# Patient Record
Sex: Male | Born: 1965
Health system: Southern US, Community
[De-identification: ages and names within clinical notes are randomized; demographics above are authoritative.]

## PROBLEM LIST (undated history)

## (undated) DIAGNOSIS — M109 Gout, unspecified: Secondary | ICD-10-CM

---

## 2016-09-21 ENCOUNTER — Emergency Department (HOSPITAL_BASED_OUTPATIENT_CLINIC_OR_DEPARTMENT_OTHER): Payer: BLUE CROSS/BLUE SHIELD

## 2016-09-21 ENCOUNTER — Encounter (HOSPITAL_BASED_OUTPATIENT_CLINIC_OR_DEPARTMENT_OTHER): Payer: Self-pay | Admitting: *Deleted

## 2016-09-21 ENCOUNTER — Emergency Department (HOSPITAL_BASED_OUTPATIENT_CLINIC_OR_DEPARTMENT_OTHER)
Admission: EM | Admit: 2016-09-21 | Discharge: 2016-09-21 | Disposition: A | Discharge status: Home / Self Care | Payer: BLUE CROSS/BLUE SHIELD | Attending: Emergency Medicine | Admitting: Emergency Medicine

## 2016-09-21 DIAGNOSIS — M109 Gout, unspecified: Secondary | ICD-10-CM

## 2016-09-21 DIAGNOSIS — M10062 Idiopathic gout, left knee: Secondary | ICD-10-CM | POA: Diagnosis not present

## 2016-09-21 DIAGNOSIS — M7989 Other specified soft tissue disorders: Secondary | ICD-10-CM

## 2016-09-21 DIAGNOSIS — M25562 Pain in left knee: Secondary | ICD-10-CM | POA: Diagnosis present

## 2016-09-21 HISTORY — DX: Gout, unspecified: M10.9

## 2016-09-21 LAB — BASIC METABOLIC PANEL
Anion gap: 10 (ref 5–15)
BUN: 14 mg/dL (ref 6–20)
CHLORIDE: 101 mmol/L (ref 101–111)
CO2: 27 mmol/L (ref 22–32)
Calcium: 9 mg/dL (ref 8.9–10.3)
Creatinine, Ser: 1.13 mg/dL (ref 0.61–1.24)
GFR calc non Af Amer: >60 mL/min (ref >60)
Glucose, Bld: 97 mg/dL (ref 65–99)
Potassium: 3.9 mmol/L (ref 3.5–5.1)
Sodium: 138 mmol/L (ref 135–145)

## 2016-09-21 LAB — CBC WITH DIFFERENTIAL/PLATELET
Basophils Absolute: 0.1 10*3/uL (ref 0.0–0.1)
Basophils Relative: 1 %
EOS PCT: 1 %
Eosinophils Absolute: 0.1 10*3/uL (ref 0.0–0.7)
HEMATOCRIT: 38.5 % — AB (ref 39.0–52.0)
Hemoglobin: 13.3 g/dL (ref 13.0–17.0)
LYMPHS ABS: 1.7 10*3/uL (ref 0.7–4.0)
LYMPHS PCT: 17 %
MCH: 29.4 pg (ref 26.0–34.0)
MCHC: 34.5 g/dL (ref 30.0–36.0)
MCV: 85.2 fL (ref 78.0–100.0)
MONO ABS: 0.9 10*3/uL (ref 0.1–1.0)
Monocytes Relative: 9 %
NEUTROS ABS: 7 10*3/uL (ref 1.7–7.7)
Neutrophils Relative %: 72 %
Platelets: 365 10*3/uL (ref 150–400)
RBC: 4.52 MIL/uL (ref 4.22–5.81)
RDW: 12.3 % (ref 11.5–15.5)
WBC: 9.8 10*3/uL (ref 4.0–10.5)

## 2016-09-21 LAB — URIC ACID: URIC ACID, SERUM: 8.2 mg/dL — AB (ref 4.4–7.6)

## 2016-09-21 MED ORDER — PREDNISONE 20 MG PO TABS: ORAL_TABLET | ORAL | 0 refills | Status: AC

## 2016-09-21 MED ORDER — OXYCODONE-ACETAMINOPHEN 5-325 MG PO TABS: 1.0000 | ORAL_TABLET | ORAL | 0 refills | Status: AC | PRN

## 2016-09-21 MED ORDER — OXYCODONE-ACETAMINOPHEN 5-325 MG PO TABS
1.0000 | ORAL_TABLET | Freq: Once | ORAL | Status: AC
  Administered 2016-09-21: 1 via ORAL
  Filled 2016-09-21: qty 1

## 2016-09-21 MED FILL — predniSONE 20 MG TABS: 20 | 9 days supply | Qty: 12 | Fill #0

## 2016-09-21 MED FILL — OXYCODONE/APAP 5/325 MG TAB: 5-325 | 4 days supply | Qty: 20 | Fill #0

## 2016-09-21 NOTE — ED Provider Notes (Signed)
[facility] Provider Note   CSN: [id number] Arrival date & time: 09/21/16  4098     History   Chief Complaint Chief Complaint  Patient presents with  . Knee Pain    HPI Bradley Estes is a 51 y.o. male.  The history is provided by the patient and medical records.  Knee Pain     51 y.o. M with hx of gout here left knee and leg pain.  Patient states he started having symptoms about a week ago.  States initially pain was localized to the left knee, improved somewhat with anti-inflammatories. He reports now pain seems to be radiating out and he is having some swelling of the left upper and lower leg which is atypical for him during gout flare. State he does have a lot of pressure in the knee when walking.  He denies any chest pain or shortness of breath. Has no known history of heart failure. He denies any recent injury to his left knee. No recent surgeries. No recent long distance travel, surgery, prolonged immobilization. He has no history of DVT or PE. No familial clotting history.  Past Medical History:  Diagnosis Date  . Gout     There are no active problems to display for this patient.   History reviewed. No pertinent surgical history.     Home Medications    Prior to Admission medications   Not on File    Family History History reviewed. No pertinent family history.  Social History Social History  Substance Use Topics  . Smoking status: Never Smoker  . Smokeless tobacco: Never Used  . Alcohol use Not on file     Allergies   Patient has no allergy information on record.   Review of Systems Review of Systems  Musculoskeletal: Positive for arthralgias.  All other systems reviewed and are negative.    Physical Exam Updated Vital Signs BP (!) 165/95 (BP Location: Right Arm)   Pulse 97   Temp 97.6 F (36.4 C) (Oral)   Resp 14   Ht 6' (1.829 m)   Wt 106.6 kg   SpO2 97%   BMI 31.87 kg/m   Physical Exam  Constitutional: He is oriented  to person, place, and time. He appears well-developed and well-nourished.  HENT:  Head: Normocephalic and atraumatic.  Mouth/Throat: Oropharynx is clear and moist.  Eyes: Conjunctivae and EOM are normal. Pupils are equal, round, and reactive to light.  Neck: Normal range of motion.  Cardiovascular: Normal rate, regular rhythm and normal heart sounds.   Pulmonary/Chest: Effort normal and breath sounds normal.  Abdominal: Soft. Bowel sounds are normal.  Musculoskeletal: Normal range of motion.  Left knee does have some mild swelling along the medial and lateral joint lines, there is no significant effusion, limited flexion and extension due to pain/swelling, there is 1+ pitting edema of the shin and swelling of the left thigh; no overlying erythema, lymphangitis, warmth to touch, or evidence of cellulitis, DP pulses intact bilaterally, normal gait  Neurological: He is alert and oriented to person, place, and time.  Skin: Skin is warm and dry.  Psychiatric: He has a normal mood and affect.  Nursing note and vitals reviewed.    ED Treatments / Results  Labs (all labs ordered are listed, but only abnormal results are displayed) Labs Reviewed  CBC WITH DIFFERENTIAL/PLATELET - Abnormal; Notable for the following:       Result Value   HCT 38.5 (*)    All other components within  normal limits  URIC ACID - Abnormal; Notable for the following:    Uric Acid, Serum 8.2 (*)    All other components within normal limits  BASIC METABOLIC PANEL    EKG  EKG Interpretation None       Radiology US Venous Img Lower Unilateral Left  Result Date: 09/21/2016 CLINICAL DATA:  51 year old male with left knee pain and swelling for 2-3 weeks. Personal history of gout. EXAM: LEFT LOWER EXTREMITY VENOUS DOPPLER ULTRASOUND TECHNIQUE: Gray-scale sonography with graded compression, as well as color Doppler and duplex ultrasound were performed to evaluate the lower extremity deep venous systems from the level  of the common femoral vein and including the common femoral, femoral, profunda femoral, popliteal and calf veins including the posterior tibial, peroneal and gastrocnemius veins when visible. The superficial great saphenous vein was also interrogated. Spectral Doppler was utilized to evaluate flow at rest and with distal augmentation maneuvers in the common femoral, femoral and popliteal veins. COMPARISON:  None. FINDINGS: Contralateral Common Femoral Vein: Respiratory phasicity is normal and symmetric with the symptomatic side. No evidence of thrombus. Normal compressibility. Common Femoral Vein: No evidence of thrombus. Normal compressibility, respiratory phasicity and response to augmentation. Saphenofemoral Junction: No evidence of thrombus. Normal compressibility and flow on color Doppler imaging. Profunda Femoral Vein: No evidence of thrombus. Normal compressibility and flow on color Doppler imaging. Femoral Vein: No evidence of thrombus. Normal compressibility, respiratory phasicity and response to augmentation. Popliteal Vein: No evidence of thrombus. Normal compressibility, respiratory phasicity and response to augmentation. Calf Veins: No evidence of thrombus. Normal compressibility and flow on color Doppler imaging. Superficial Great Saphenous Vein: No evidence of thrombus. Normal compressibility and flow on color Doppler imaging. Venous Reflux:  None. Other Findings:  None. IMPRESSION: No evidence of left lower extremity deep venous thrombosis. Electronically Signed   By: Odessa Fleming M.D.   On: 09/21/2016 10:53   Dg Knee Complete 4 Views Left  Result Date: 09/21/2016 CLINICAL DATA:  Unable to straighten the left knee.  Left knee pain. EXAM: LEFT KNEE - COMPLETE 4+ VIEW COMPARISON:  None. FINDINGS: No acute fracture or dislocation. Small left knee joint effusion. Mild lateral femorotibial compartment joint space narrowing. Soft tissues are normal. IMPRESSION: No acute osseous injury of the left knee.  Electronically Signed   By: Elige Ko   On: 09/21/2016 10:58    Procedures Procedures (including critical care time)  Medications Ordered in ED Medications - No data to display   Initial Impression / Assessment and Plan / ED Course  I have reviewed the triage vital signs and the nursing notes.  Pertinent labs & imaging results that were available during my care of the patient were reviewed by me and considered in my medical decision making (see chart for details).  51 year old male with left knee pain and left leg swelling. He is afebrile and nontoxic. There is no significant erythema, warmth to touch, lymphangitis, or other abnormalities noted of the left leg or knee. He does have some 1+ pitting edema of the lower leg but no palpable cords.  DP pulse intact, normal sensation.  VSS.  Clinical suspicion for septic joint is low.  Likely acute gout flare vs DVT.  Labs and imaging studies ordered.  Labs as above-- normal WBC count, uric acid elevated.  X-ray and venous duplex negative for acute findings.  No chest pain or SOB.  Does not appear clinically fluid overloaded.  Suspect the swelling is sequela of his gout flare, possibly  due to the fact it has been somewhat prolonged compared with his normal course.  Will start prednisone, percocet.  Encouraged to elevate at home to help with swelling.  FU with PCP later this week for re-check.  Discussed plan with patient, he acknowledged understanding and agreed with plan of care.  Return precautions given for new or worsening symptoms.  Final Clinical Impressions(s) / ED Diagnoses   Final diagnoses:  Left leg swelling  Acute gout of left knee, unspecified cause    New Prescriptions New Prescriptions   OXYCODONE-ACETAMINOPHEN (PERCOCET/ROXICET) 5-325 MG TABLET    Take 1 tablet by mouth every 4 (four) hours as needed.   PREDNISONE (DELTASONE) 20 MG TABLET    Take 40 mg by mouth daily for 3 days, then  by mouth daily for 3 days, then   daily for 3 days     Garlon Hatchet, PA-C 09/21/16 [address] PA-C 09/21/16 1332    Alvira Monday, MD 09/24/16 [phone]

## 2016-09-21 NOTE — ED Triage Notes (Signed)
Pt reports 2 weeks of left knee aching and swelling, initially thought this was his usual gout presentation, but over the last few days he has had swelling and pain in his left thigh and pitting edema to his lower leg.

## 2016-09-21 NOTE — ED Notes (Signed)
Pt teaching provided on medications that may cause drowsiness. Pt instructed not to drive or operate heavy machinery while taking the prescribed medication. Pt verbalized understanding.   

## 2016-09-21 NOTE — Discharge Instructions (Signed)
Take the prescribed medication as directed.  Do not drive while taking the pain medication, it can make you drowsy. Follow-up with your primary care doctor for re-check. Return to the ED for new or worsening symptoms.

## 2016-09-21 NOTE — ED Notes (Signed)
Patient is in Ultrasound at this time.

## 2018-03-04 IMAGING — CR DG KNEE COMPLETE 4+V*L*
4 series · 4 of 4 positions shown · non-contrast
Comparison: None.

CLINICAL DATA: Unable to straighten the left knee.  Left knee pain.

EXAM:
LEFT KNEE - COMPLETE 4+ VIEW

[t knee ap left]
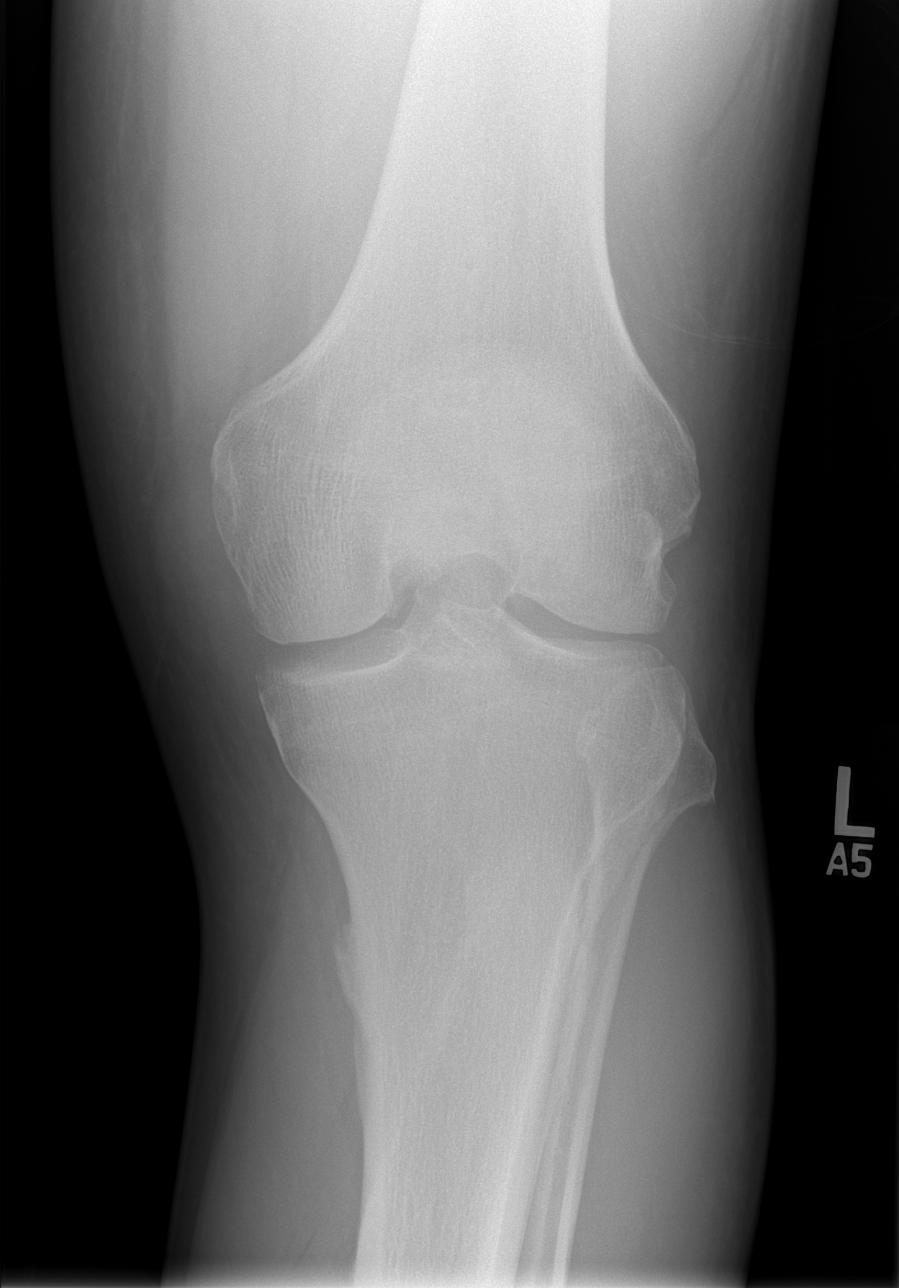

[t knee oblique left (1 of 2)]
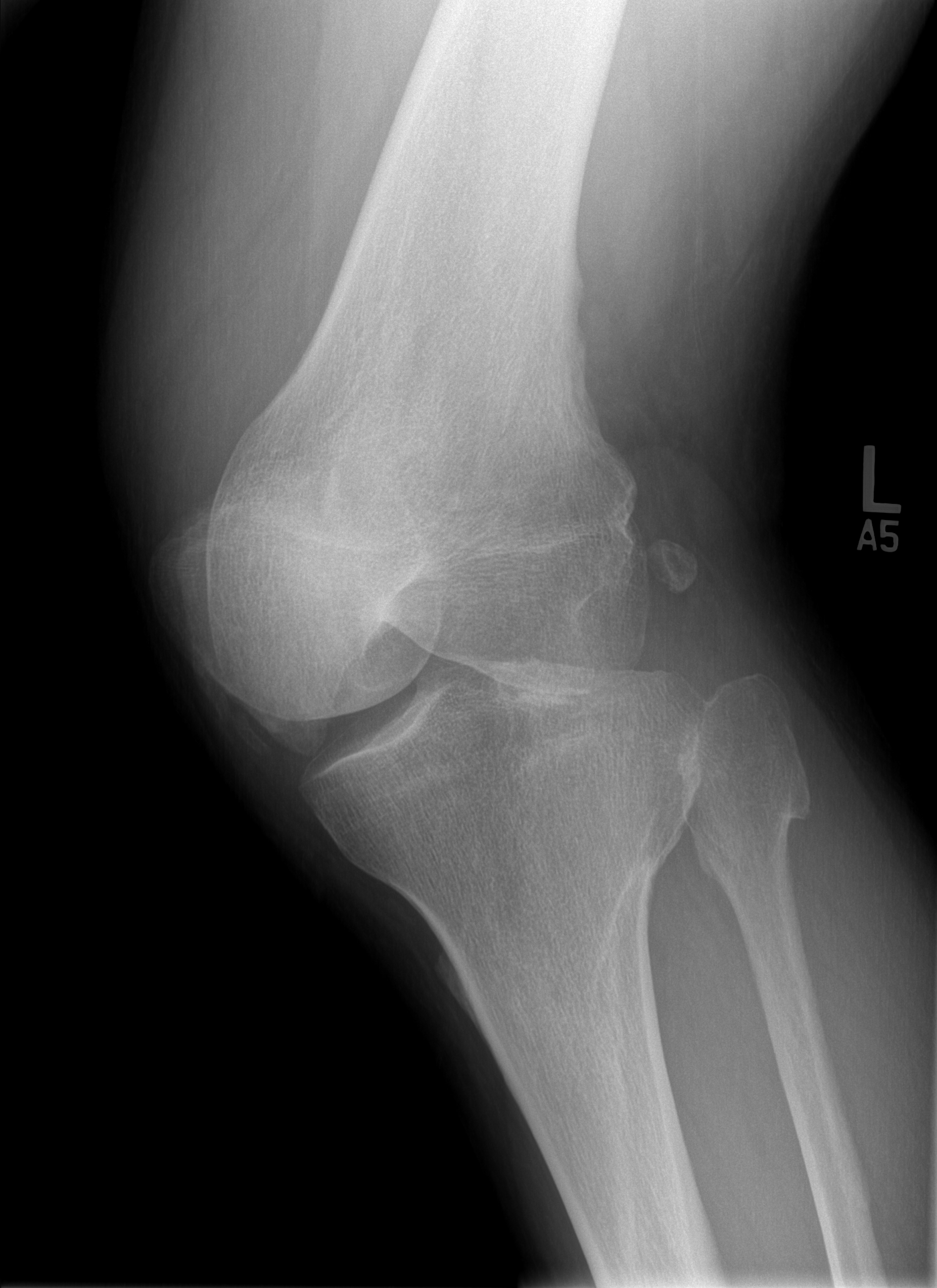

[t knee oblique left (2 of 2)]
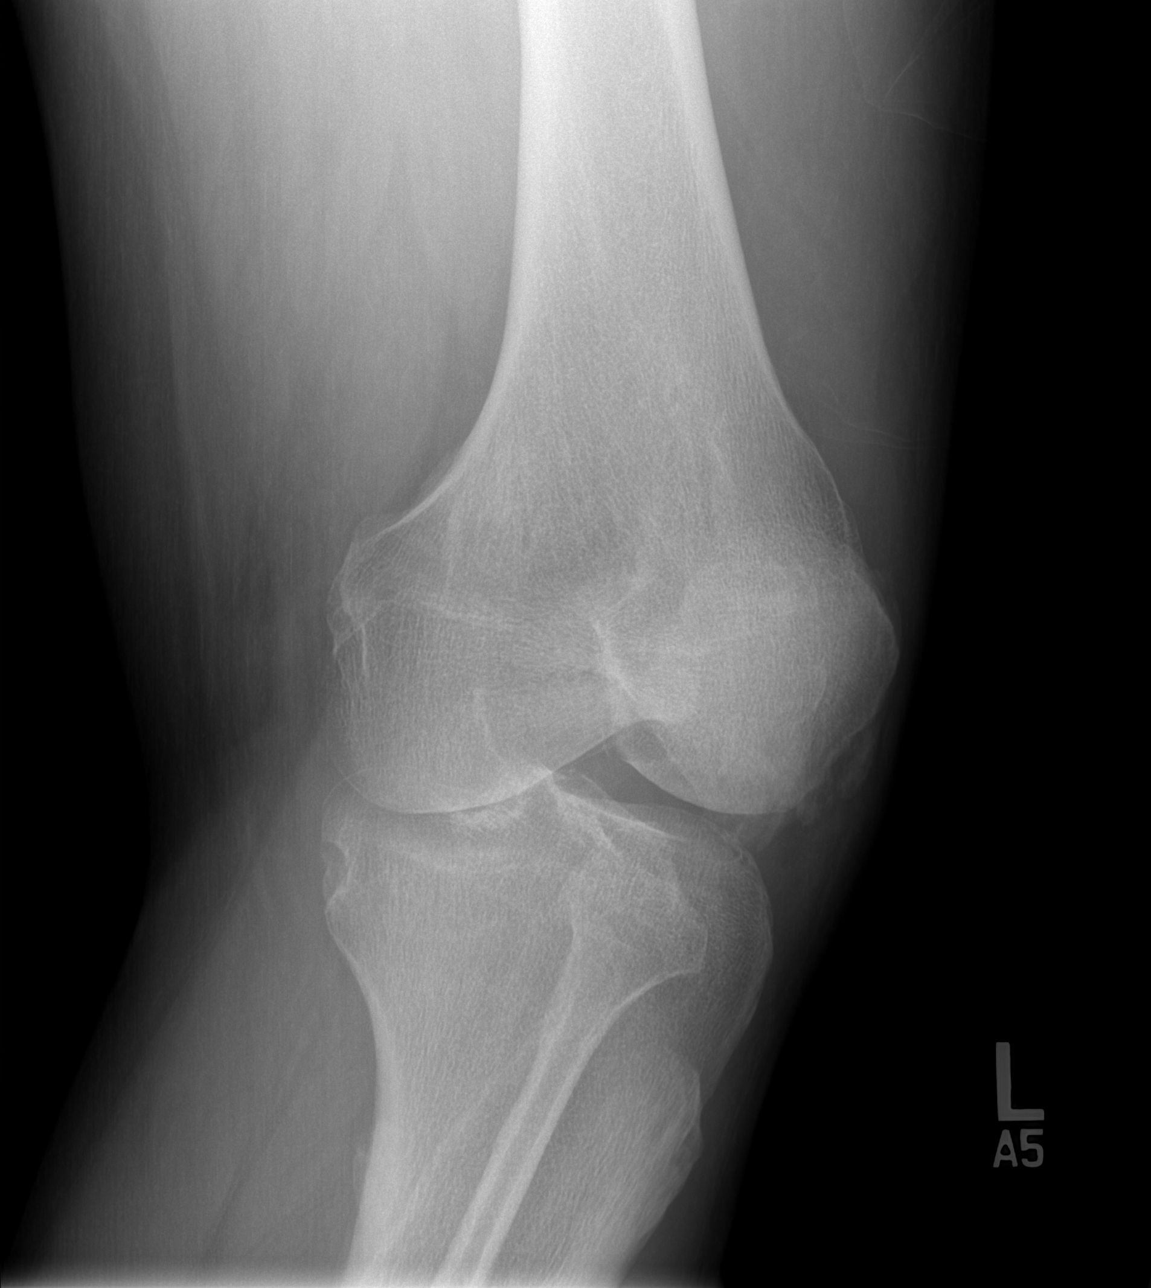

[t knee lat left]
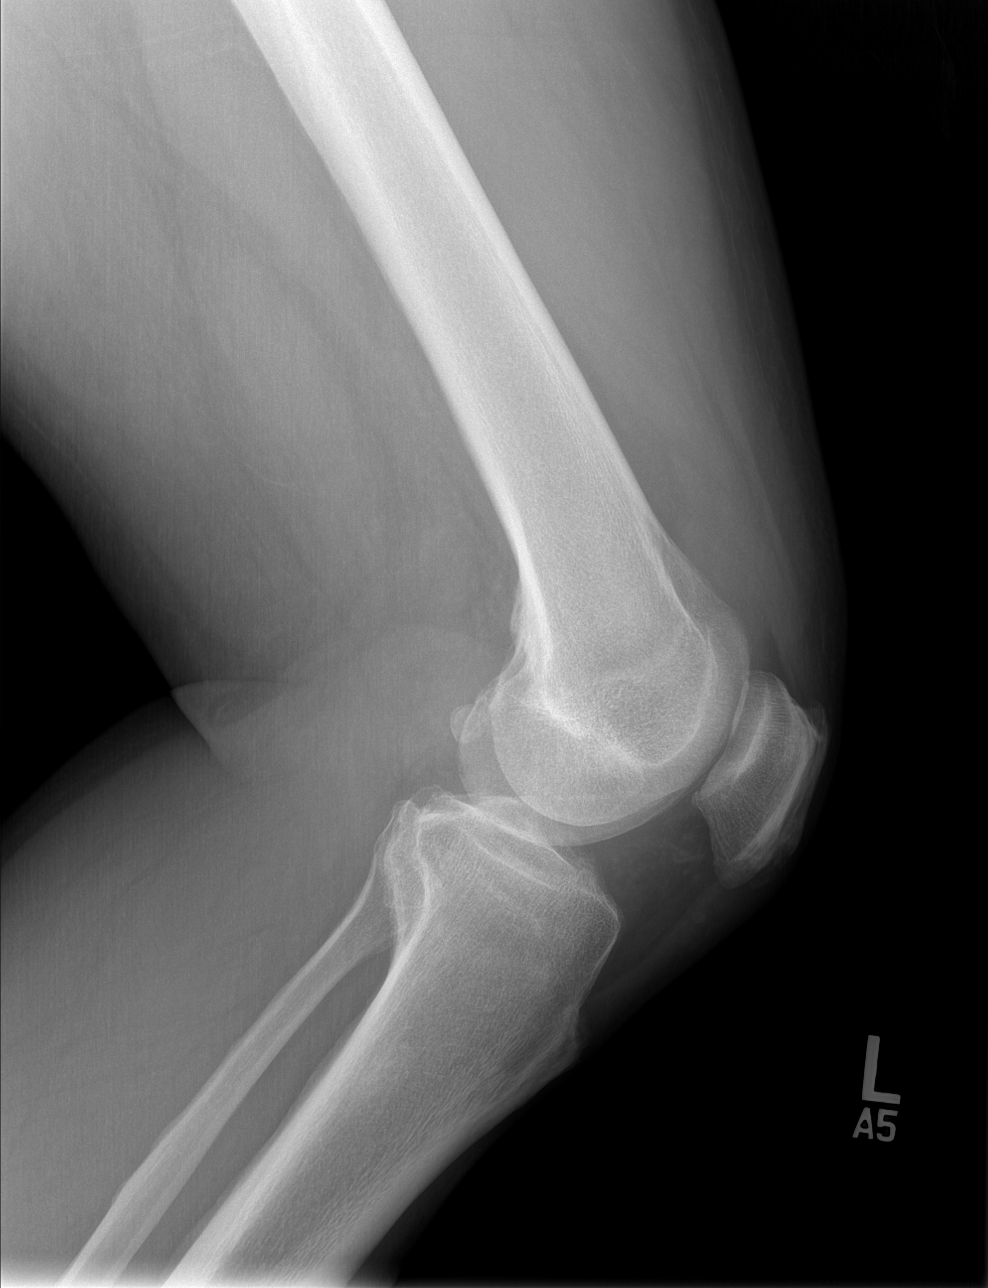

[4 of 4 positions shown; findings below may reference images not displayed]

FINDINGS: No acute fracture or dislocation. Small left knee joint effusion.
Mild lateral femorotibial compartment joint space narrowing. Soft
tissues are normal.
IMPRESSION: No acute osseous injury of the left knee.
